# Patient Record
Sex: Male | Born: 1990 | Race: White | Hispanic: No | Marital: Single
Health system: Southern US, Community
[De-identification: ages and names within clinical notes are randomized; demographics above are authoritative.]

---
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
## 2017-05-07 ENCOUNTER — Telehealth (INDEPENDENT_AMBULATORY_CARE_PROVIDER_SITE_OTHER): Payer: Self-pay | Admitting: Orthopaedic Surgery

## 2017-05-07 NOTE — Telephone Encounter (Signed)
Received a request for records from [facility]/This patient has not been treated by us/I faxed back advising NO Records
